# Patient Record
Sex: Male | Born: 1995 | Race: Black or African American | Hispanic: No | Marital: Single | Smoking: Never smoker
Health system: Southern US, Community
[De-identification: ages and names within clinical notes are randomized; demographics above are authoritative.]

## PROBLEM LIST (undated history)

## (undated) DIAGNOSIS — B2790 Infectious mononucleosis, unspecified without complication: Secondary | ICD-10-CM

## (undated) HISTORY — PX: WISDOM TOOTH EXTRACTION: SHX21

---
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
## 2018-02-21 IMAGING — DX DG CHEST 2V
2 series · 2 of 2 positions shown · non-contrast
Comparison: 12/14/2013

CLINICAL DATA: Productive cough for 2 weeks, vomiting and nausea
this morning

EXAM:
CHEST  2 VIEW

[chest pa]
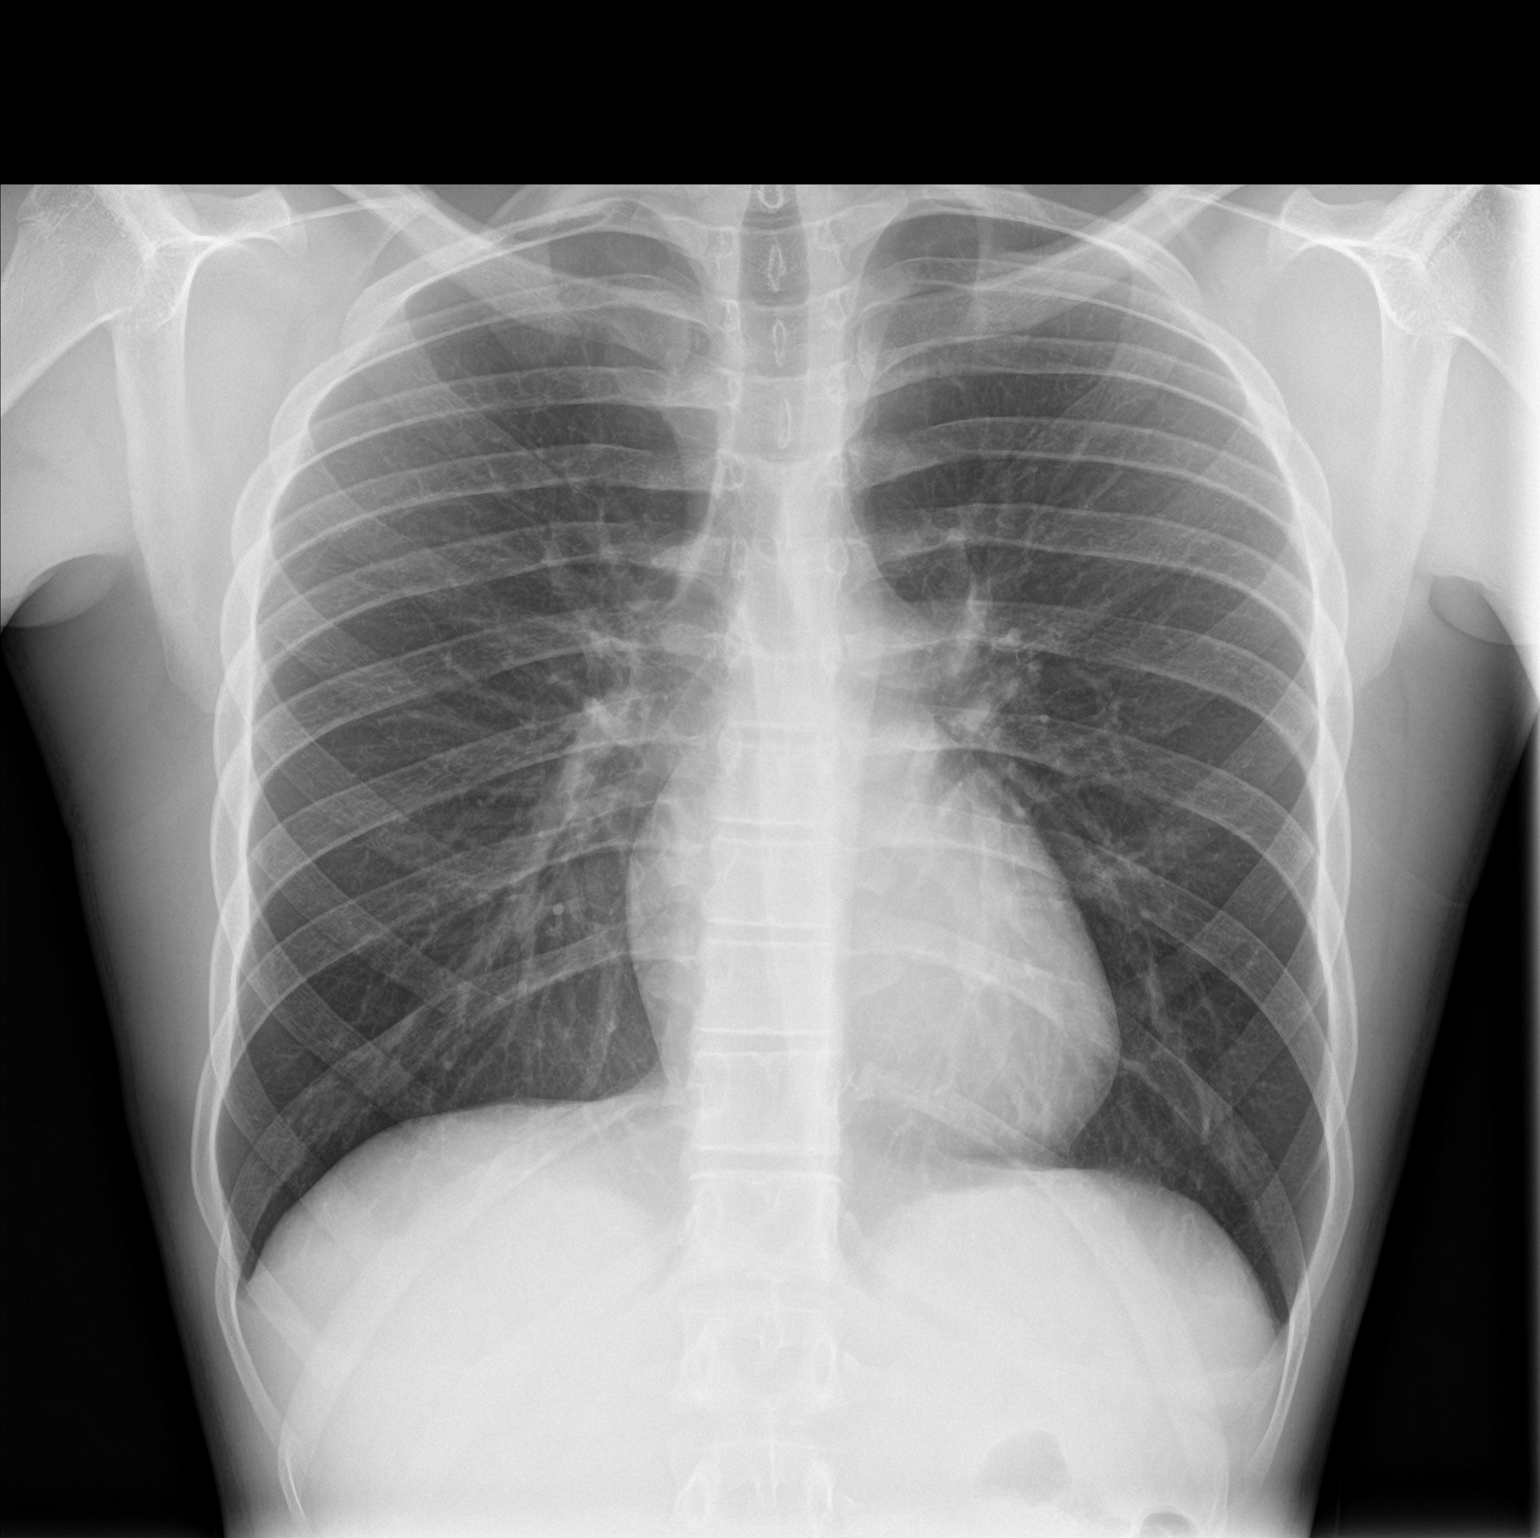

[chest lat]
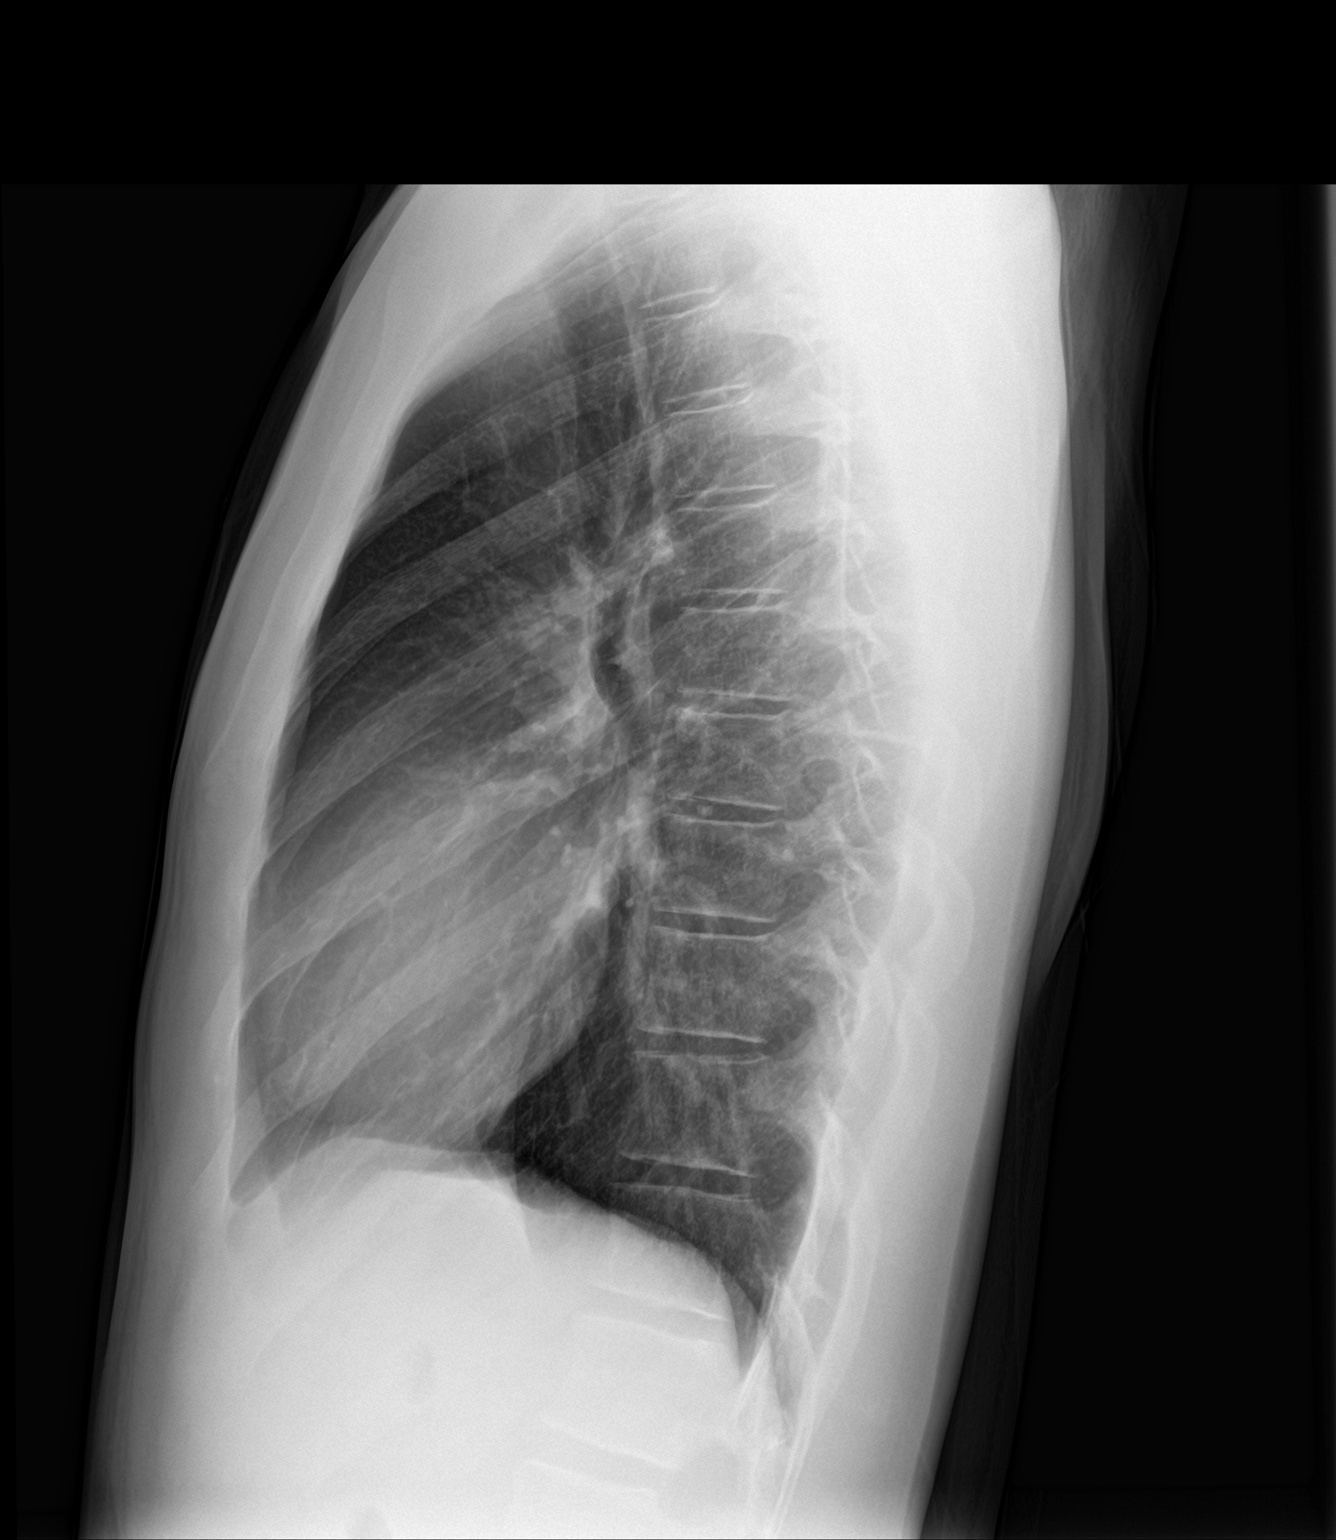

[2 of 2 positions shown; findings below may reference images not displayed]

FINDINGS: Normal heart size, mediastinal contours, and pulmonary vascularity.

Lungs clear.

No pleural effusion or pneumothorax.

Bones unremarkable.
IMPRESSION: Normal exam.

## 2018-03-23 IMAGING — CR DG CHEST 2V
2 series · 2 of 2 positions shown · non-contrast
Comparison: 05/15/2016

CLINICAL DATA: Cough and fever.  Recent diagnosis of mononucleosis.

EXAM:
CHEST  2 VIEW

[w chest pa]
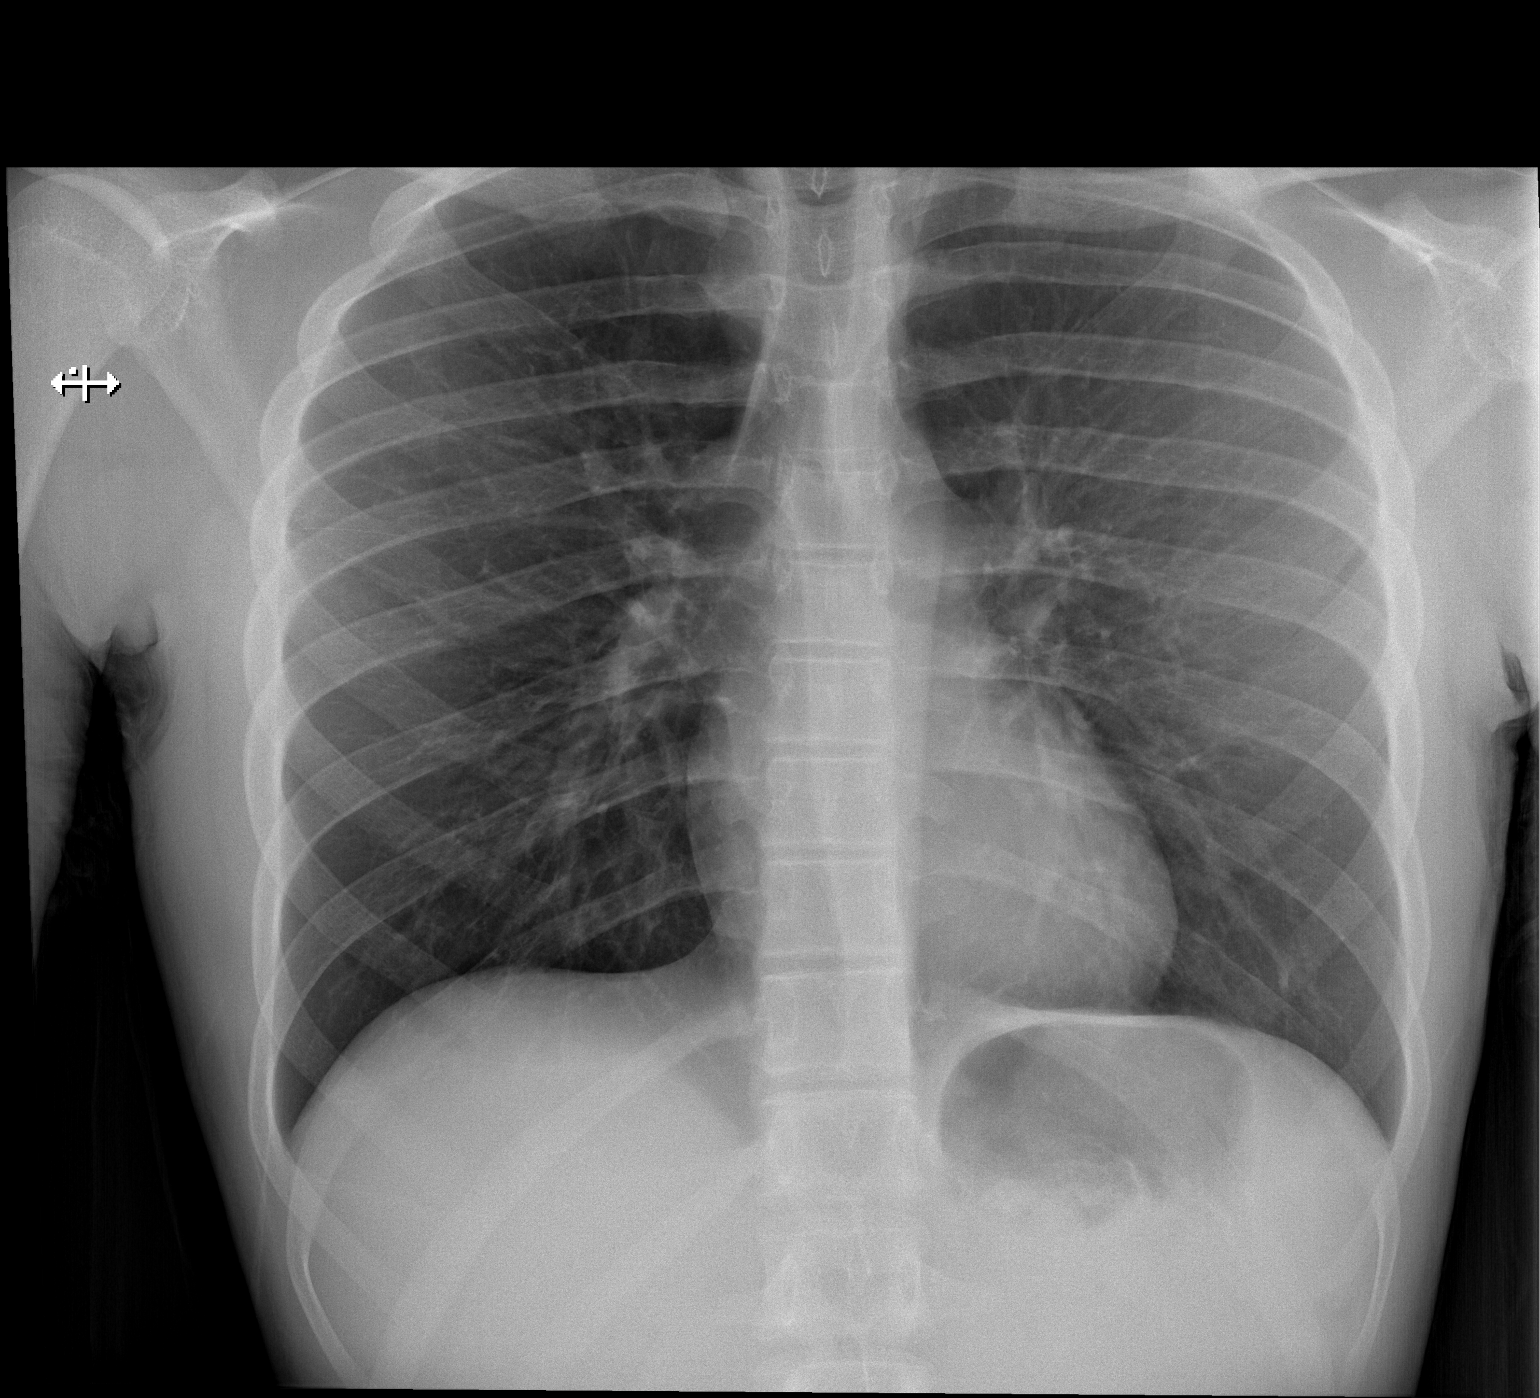

[w chest lat]
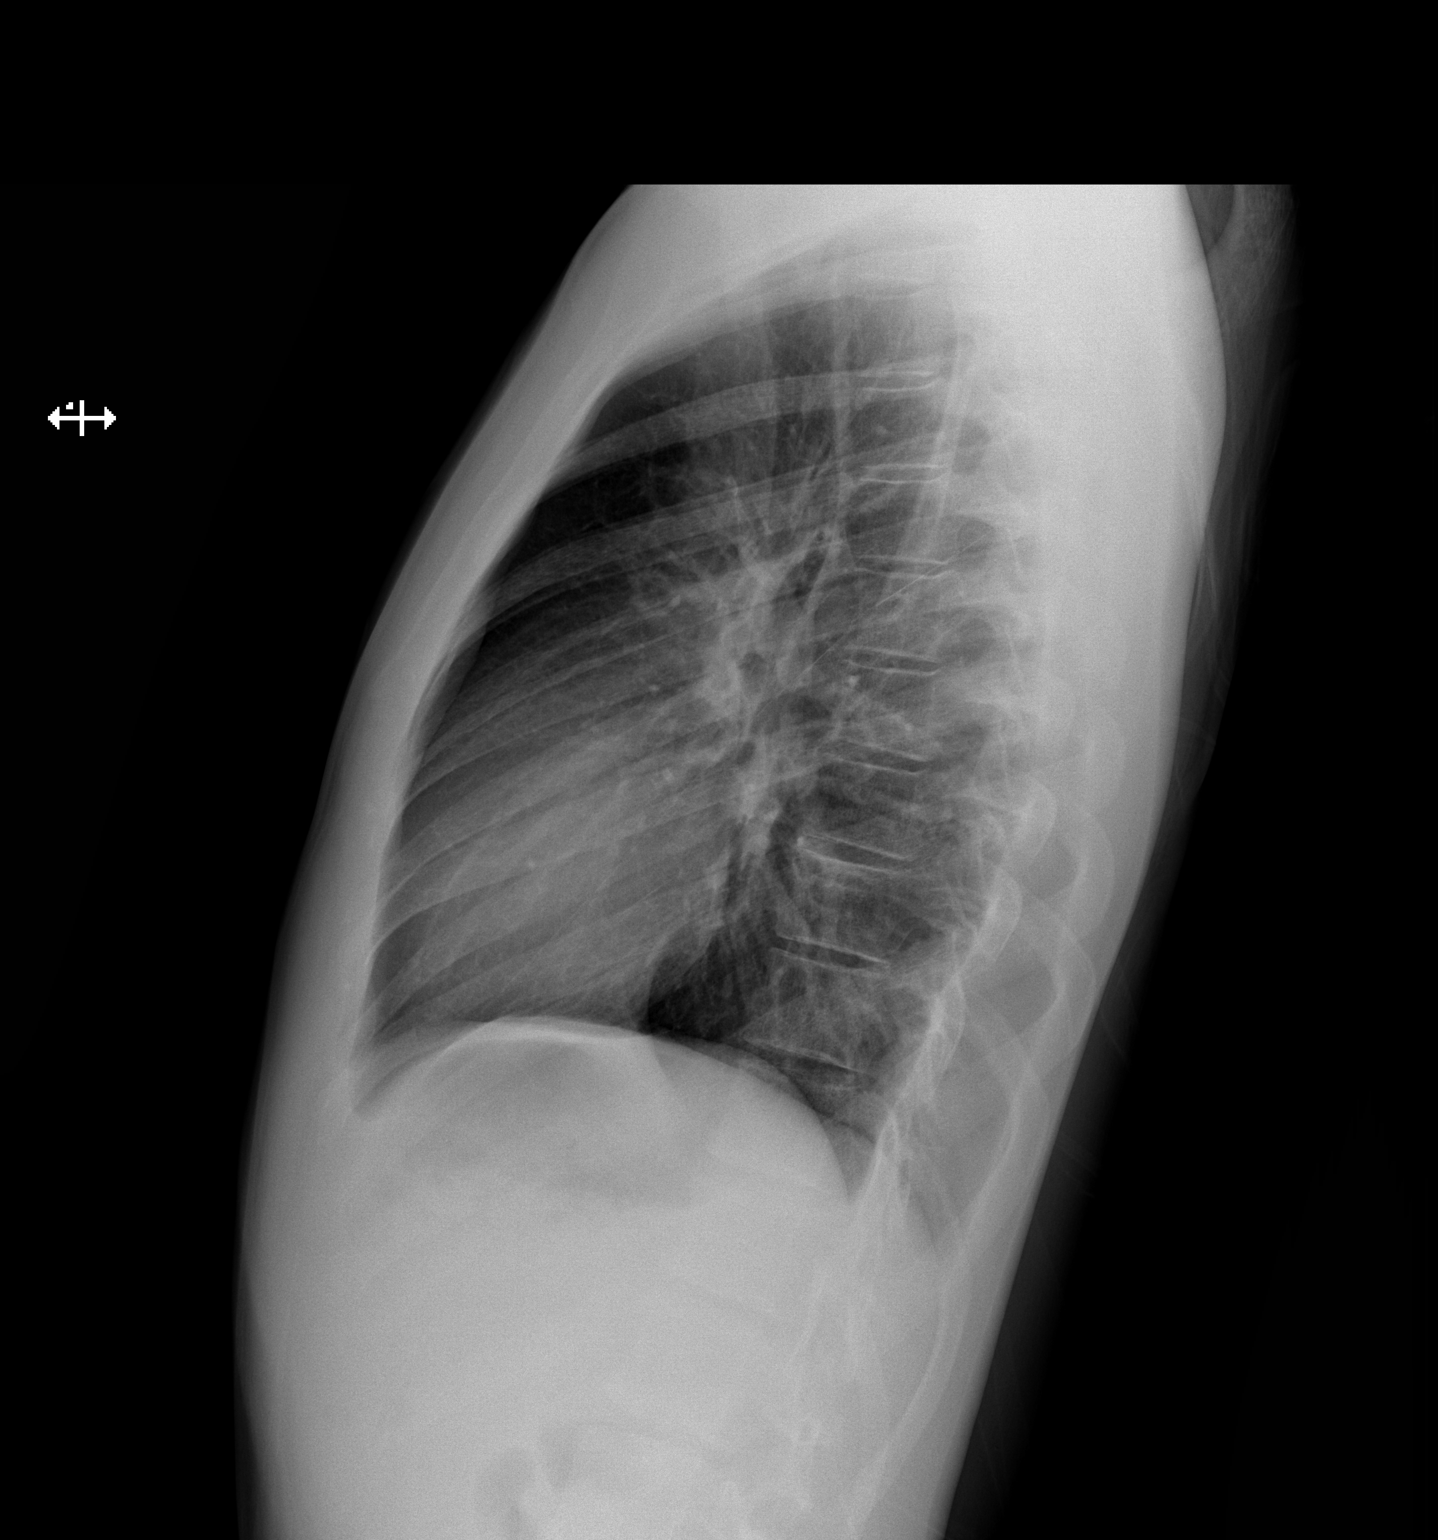

[2 of 2 positions shown; findings below may reference images not displayed]

FINDINGS: The cardiomediastinal contours are normal. The lungs are clear.
Pulmonary vasculature is normal. No consolidation, pleural effusion,
or pneumothorax. No acute osseous abnormalities are seen.
IMPRESSION: No active cardiopulmonary disease.  No change from prior exam.

## 2018-03-27 ENCOUNTER — Encounter (HOSPITAL_COMMUNITY): Payer: Self-pay | Admitting: Emergency Medicine

## 2018-03-27 ENCOUNTER — Emergency Department (HOSPITAL_COMMUNITY)
Admission: EM | Admit: 2018-03-27 | Discharge: 2018-03-27 | Disposition: A | Discharge status: Home / Self Care | Payer: Self-pay | Attending: Emergency Medicine | Admitting: Emergency Medicine

## 2018-03-27 DIAGNOSIS — R51 Headache: Secondary | ICD-10-CM | POA: Insufficient documentation

## 2018-03-27 DIAGNOSIS — J0101 Acute recurrent maxillary sinusitis: Secondary | ICD-10-CM | POA: Insufficient documentation

## 2018-03-27 DIAGNOSIS — Z79899 Other long term (current) drug therapy: Secondary | ICD-10-CM | POA: Insufficient documentation

## 2018-03-27 DIAGNOSIS — E860 Dehydration: Secondary | ICD-10-CM | POA: Insufficient documentation

## 2018-03-27 LAB — COMPREHENSIVE METABOLIC PANEL
ALBUMIN: 4.4 g/dL (ref 3.5–5.0)
ALT: 12 U/L (ref 0–44)
ANION GAP: 7 (ref 5–15)
AST: 18 U/L (ref 15–41)
Alkaline Phosphatase: 79 U/L (ref 38–126)
BUN: 13 mg/dL (ref 6–20)
CO2: 27 mmol/L (ref 22–32)
Calcium: 9.2 mg/dL (ref 8.9–10.3)
Chloride: 107 mmol/L (ref 98–111)
Creatinine, Ser: 1.03 mg/dL (ref 0.61–1.24)
GFR calc non Af Amer: >60 mL/min (ref >60)
GLUCOSE: 104 mg/dL — AB (ref 70–99)
POTASSIUM: 4 mmol/L (ref 3.5–5.1)
SODIUM: 141 mmol/L (ref 135–145)
Total Bilirubin: 2.1 mg/dL — ABNORMAL HIGH (ref 0.3–1.2)
Total Protein: 7.4 g/dL (ref 6.5–8.1)

## 2018-03-27 LAB — CBC
HEMATOCRIT: 42.4 % (ref 39.0–52.0)
HEMOGLOBIN: 13.9 g/dL (ref 13.0–17.0)
MCH: 31.3 pg (ref 26.0–34.0)
MCHC: 32.8 g/dL (ref 30.0–36.0)
MCV: 95.5 fL (ref 78.0–100.0)
Platelets: 267 10*3/uL (ref 150–400)
RBC: 4.44 MIL/uL (ref 4.22–5.81)
RDW: 11.2 % — ABNORMAL LOW (ref 11.5–15.5)
WBC: 5.6 10*3/uL (ref 4.0–10.5)

## 2018-03-27 LAB — URINALYSIS, ROUTINE W REFLEX MICROSCOPIC
BILIRUBIN URINE: NEGATIVE
Glucose, UA: NEGATIVE mg/dL
Hgb urine dipstick: NEGATIVE
Ketones, ur: NEGATIVE mg/dL
Leukocytes, UA: NEGATIVE
NITRITE: NEGATIVE
PH: 6 (ref 5.0–8.0)
Protein, ur: NEGATIVE mg/dL
SPECIFIC GRAVITY, URINE: 1.024 (ref 1.005–1.030)

## 2018-03-27 LAB — LIPASE, BLOOD: Lipase: 32 U/L (ref 11–51)

## 2018-03-27 LAB — COOXEMETRY PANEL
CARBOXYHEMOGLOBIN: 0.9 % (ref 0.5–1.5)
METHEMOGLOBIN: 1 % (ref 0.0–1.5)
O2 SAT: 97.5 %
Total hemoglobin: 13.3 g/dL (ref 12.0–16.0)

## 2018-03-27 MED ORDER — PROMETHAZINE HCL 25 MG PO TABS: 25.0000 mg | ORAL_TABLET | Freq: Three times a day (TID) | ORAL | 0 refills | Status: DC | PRN

## 2018-03-27 MED ORDER — SODIUM CHLORIDE 0.9 % IV BOLUS
1000.0000 mL | Freq: Once | INTRAVENOUS | Status: AC
  Administered 2018-03-27: 1000 mL via INTRAVENOUS

## 2018-03-27 MED ORDER — METOCLOPRAMIDE HCL 5 MG/ML IJ SOLN
10.0000 mg | Freq: Once | INTRAMUSCULAR | Status: AC
Start: 2018-03-27 — End: 2018-03-27
  Administered 2018-03-27: 10 mg via INTRAVENOUS
  Filled 2018-03-27: qty 2

## 2018-03-27 MED ORDER — KETOROLAC TROMETHAMINE 15 MG/ML IJ SOLN
15.0000 mg | Freq: Once | INTRAMUSCULAR | Status: AC
  Administered 2018-03-27: 15 mg via INTRAVENOUS
  Filled 2018-03-27: qty 1

## 2018-03-27 MED ORDER — DIPHENHYDRAMINE HCL 50 MG/ML IJ SOLN
25.0000 mg | Freq: Once | INTRAMUSCULAR | Status: AC
Start: 2018-03-27 — End: 2018-03-27
  Administered 2018-03-27: 25 mg via INTRAVENOUS
  Filled 2018-03-27: qty 1

## 2018-03-27 MED ORDER — IBUPROFEN 600 MG PO TABS: 600.0000 mg | ORAL_TABLET | Freq: Three times a day (TID) | ORAL | 0 refills | Status: DC | PRN

## 2018-03-27 MED ORDER — FLUTICASONE PROPIONATE 50 MCG/ACT NA SUSP: 2.0000 | Freq: Every day | NASAL | 0 refills | Status: DC

## 2018-03-27 MED ORDER — AMOXICILLIN-POT CLAVULANATE 875-125 MG PO TABS: 1.0000 | ORAL_TABLET | Freq: Two times a day (BID) | ORAL | 0 refills | Status: AC

## 2018-03-27 NOTE — ED Provider Notes (Signed)
[facility]-EMERGENCY DEPT Provider Note   CSN: [id number] Arrival date & time: 03/27/18  8657     History   Chief Complaint Chief Complaint  Patient presents with  . Nausea  . Headache    HPI Louis Camacho is a 22 y.o. male.  HPI   22 yo M with PMHx as below here with mild nausea, headache. Pt states he awoke 3 days ago with mild nausea, vomiting. He has had persistent nausea since then, but has been able to eat/drink. No abdominal pain. No dysuria. He has also had a mild HA x 1 week. HA began gradually, is described as a frontal HA with sinus pressure, nasal congestion. HA worsens when leaning forward. No alleviating factors. 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 No recent trauma. No current HA, neck stiffness, rigidity, photophobia, or phonophobia.    Past Medical History:  Diagnosis Date  . Mononucleosis     There are no active problems to display for this patient.   Past Surgical History:  Procedure Laterality Date  . WISDOM TOOTH EXTRACTION          Home Medications    Prior to Admission medications   Medication Sig Start Date End Date Taking? Authorizing Provider  amoxicillin-clavulanate (AUGMENTIN) 875-125 MG tablet Take 1 tablet by mouth every 12 (twelve) hours for 10 days. 03/27/18 04/06/18  Shaune Pollack, MD  fluticasone (FLONASE) 50 MCG/ACT nasal spray Place 2 sprays into both nostrils daily. 03/27/18   Shaune Pollack, MD  guaiFENesin (MUCINEX) 600 MG 12 hr tablet Take 1 tablet (600 mg total) by mouth 2 (two) times daily as needed for to loosen phlegm. Patient not taking: Reported on 03/27/2018 05/15/16   Sam, Ace Gins, PA-C  ibuprofen (ADVIL,MOTRIN) 600 MG tablet Take 1 tablet (600 mg total) by mouth every 8 (eight) hours as needed for moderate pain. 03/27/18   Shaune Pollack, MD  lidocaine (XYLOCAINE) 2 % solution Use as directed 15 mLs in the  mouth or throat every 4 (four) hours as needed for mouth pain. Patient not taking: Reported on 03/27/2018 06/14/16   Jerre Simon, PA  loratadine (CLARITIN) 10 MG tablet Take 1 tablet (10 mg total) by mouth daily. Patient not taking: Reported on 03/27/2018 07/15/16   Hedges, Tinnie Gens, PA-C  promethazine (PHENERGAN) 25 MG tablet Take 1 tablet (25 mg total) by mouth every 8 (eight) hours as needed for nausea or vomiting. 03/27/18   Shaune Pollack, MD  promethazine-dextromethorphan (PROMETHAZINE-DM) 6.25-15 MG/5ML syrup Take 5 mLs by mouth 4 (four) times daily as needed for cough. Patient not taking: Reported on 03/27/2018 05/15/16   Carlene Coria, PA-C    Family History No family history on file.  Social History Social History   Tobacco Use  . Smoking status: Never Smoker  . Smokeless tobacco: Never Used  Substance Use Topics  . Alcohol use: No  . Drug use: No     Allergies   Maple flavor; Soy allergy; Uncaria tomentosa (cats claw); and Sugar-protein-starch   Review of Systems Review of Systems  Constitutional: Positive for fatigue. Negative for chills and fever.  HENT: Negative for congestion and rhinorrhea.   Eyes: Negative for visual disturbance.  Respiratory: Negative for cough, shortness of breath and wheezing.   Cardiovascular: Negative for chest pain and leg swelling.  Gastrointestinal: Positive for nausea and vomiting. Negative for abdominal pain and diarrhea.  Genitourinary:  Negative for dysuria and flank pain.  Musculoskeletal: Negative for neck pain and neck stiffness.  Skin: Negative for rash and wound.  Allergic/Immunologic: Negative for immunocompromised state.  Neurological: Positive for light-headedness. Negative for syncope, weakness and headaches.  All other systems reviewed and are negative.    Physical Exam Updated Vital Signs BP 113/72 (BP Location: Left Arm)   Pulse 89   Temp 98.7 F (37.1 C) (Oral)   Resp 16   SpO2 97%   Physical Exam    Constitutional: He is oriented to person, place, and time. He appears well-developed and well-nourished. No distress.  HENT:  Head: Normocephalic and atraumatic.  Mildly dry MM. Nasal congestion b/l with sinus fullness maxillary sinuses. No facial erythema. No dental/oral lesions.  Eyes: Conjunctivae are normal.  Neck: Neck supple.  No neck stiffness or rigidity.  Cardiovascular: Normal rate, regular rhythm and normal heart sounds. Exam reveals no friction rub.  No murmur heard. Pulmonary/Chest: Effort normal and breath sounds normal. No respiratory distress. He has no wheezes. He has no rales.  Abdominal: He exhibits no distension.  Musculoskeletal: He exhibits no edema.  Neurological: He is alert and oriented to person, place, and time. No cranial nerve deficit or sensory deficit. He exhibits normal muscle tone. Gait normal. GCS eye subscore is 4. GCS verbal subscore is 5. GCS motor subscore is 6.  Skin: Skin is warm. Capillary refill takes less than 2 seconds.  Psychiatric: He has a normal mood and affect.  Nursing note and vitals reviewed.    ED Treatments / Results  Labs (all labs ordered are listed, but only abnormal results are displayed) Labs Reviewed  COMPREHENSIVE METABOLIC PANEL - Abnormal; Notable for the following components:      Result Value   Glucose, Bld 104 (*)    Total Bilirubin 2.1 (*)    All other components within normal limits  CBC - Abnormal; Notable for the following components:   RDW 11.2 (*)    All other components within normal limits  URINALYSIS, ROUTINE W REFLEX MICROSCOPIC - Abnormal; Notable for the following components:   Color, Urine AMBER (*)    All other components within normal limits  LIPASE, BLOOD  COOXEMETRY PANEL    EKG None  Radiology No results found.  Procedures Procedures (including critical care time)  Medications Ordered in ED Medications  sodium chloride 0.9 % bolus 1,000 mL (1,000 mLs Intravenous New Bag/Given 03/27/18  1015)  metoCLOPramide (REGLAN) injection 10 mg (10 mg Intravenous Given 03/27/18 1015)  diphenhydrAMINE (BENADRYL) injection 25 mg (25 mg Intravenous Given 03/27/18 1015)  ketorolac (TORADOL) 15 MG/ML injection 15 mg (15 mg Intravenous Given 03/27/18 1015)     Initial Impression / Assessment and Plan / ED Course  I have reviewed the triage vital signs and the nursing notes.  Pertinent labs & imaging results that were available during my care of the patient were reviewed by me and considered in my medical decision making (see chart for details).    22 year old male here with mild headache.  I suspect this is secondary to sinusitis which is exacerbated in the setting of exposure to multiple chemicals and smells at work as well as working in a heated environment.  He may also have a component of dehydration.  Patient given fluids and migraine meds with improvement.  He has no focal neuro deficits and has a long history of headaches, the do not feel repeat imaging is indicated.  No fever, photophobia, or signs of meningitis  or encephalitis.  His nausea is likely related to his dehydration and headache and he has no abdominal tenderness.  He has normal LFTs, CBC, and lipase.  He is tolerating p.o. without difficulty.  Will treat him for his mild sinusitis, encourage fluids, and have him rest from work for 2 days, then return home.  Final Clinical Impressions(s) / ED Diagnoses   Final diagnoses:  Acute recurrent maxillary sinusitis  Dehydration    ED Discharge Orders        Ordered    amoxicillin-clavulanate (AUGMENTIN) 875-125 MG tablet  Every 12 hours     03/27/18 1116    promethazine (PHENERGAN) 25 MG tablet  Every 8 hours PRN     03/27/18 1116    ibuprofen (ADVIL,MOTRIN) 600 MG tablet  Every 8 hours PRN     03/27/18 1116    fluticasone (FLONASE) 50 MCG/ACT nasal spray  Daily     03/27/18 1119       Shaune Pollack, MD 03/27/18 1133

## 2018-03-27 NOTE — Discharge Instructions (Signed)
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  If possible, I would recommend using a fan and trying to take regular breaks at work, as I suspect her symptoms are due to heat exposure and dehydration.

## 2018-03-27 NOTE — ED Triage Notes (Signed)
Pt c/o headaches for about 2 weeks that are constant due "to where I work at is in the heat".  Pt has nausea every morning when wakes up for the past 3 days. Reports Tuesday morning actually vomited once. Yesterday reports diarrhea

## 2018-03-27 NOTE — ED Notes (Signed)
Bed: WA09 Expected date:  Expected time:  Means of arrival:  Comments: 

## 2021-06-16 ENCOUNTER — Other Ambulatory Visit: Payer: Self-pay

## 2021-06-16 ENCOUNTER — Emergency Department (HOSPITAL_BASED_OUTPATIENT_CLINIC_OR_DEPARTMENT_OTHER)
Admission: EM | Admit: 2021-06-16 | Discharge: 2021-06-16 | Disposition: A | Discharge status: Home / Self Care | Payer: Medicaid Other | Attending: Emergency Medicine | Admitting: Emergency Medicine

## 2021-06-16 ENCOUNTER — Encounter (HOSPITAL_BASED_OUTPATIENT_CLINIC_OR_DEPARTMENT_OTHER): Payer: Self-pay

## 2021-06-16 DIAGNOSIS — Y92481 Parking lot as the place of occurrence of the external cause: Secondary | ICD-10-CM | POA: Insufficient documentation

## 2021-06-16 DIAGNOSIS — R519 Headache, unspecified: Secondary | ICD-10-CM | POA: Insufficient documentation

## 2021-06-16 MED ORDER — CYCLOBENZAPRINE HCL 10 MG PO TABS: 10.0000 mg | ORAL_TABLET | Freq: Two times a day (BID) | ORAL | 0 refills | Status: DC | PRN

## 2021-06-16 MED ORDER — IBUPROFEN 600 MG PO TABS: 600.0000 mg | ORAL_TABLET | Freq: Three times a day (TID) | ORAL | 0 refills | Status: DC | PRN
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
## 2023-10-16 ENCOUNTER — Emergency Department (HOSPITAL_COMMUNITY): Payer: Self-pay

## 2023-10-16 ENCOUNTER — Inpatient Hospital Stay (HOSPITAL_COMMUNITY): Payer: Self-pay

## 2023-10-16 ENCOUNTER — Inpatient Hospital Stay (HOSPITAL_COMMUNITY)
Admission: EM | Admit: 2023-10-16 | Discharge: 2023-10-18 | DRG: 481 | Disposition: A | Discharge status: Home / Self Care | Payer: Self-pay | Attending: Surgery | Admitting: Surgery

## 2023-10-16 ENCOUNTER — Encounter (HOSPITAL_COMMUNITY): Payer: Self-pay

## 2023-10-16 ENCOUNTER — Other Ambulatory Visit: Payer: Self-pay

## 2023-10-16 ENCOUNTER — Encounter (HOSPITAL_COMMUNITY): Admission: EM | Disposition: A | Discharge status: Home / Self Care | Payer: Self-pay | Source: Home / Self Care

## 2023-10-16 DIAGNOSIS — Z79899 Other long term (current) drug therapy: Secondary | ICD-10-CM

## 2023-10-16 DIAGNOSIS — W3400XA Accidental discharge from unspecified firearms or gun, initial encounter: Principal | ICD-10-CM

## 2023-10-16 DIAGNOSIS — S7291XA Unspecified fracture of right femur, initial encounter for closed fracture: Secondary | ICD-10-CM

## 2023-10-16 DIAGNOSIS — F17290 Nicotine dependence, other tobacco product, uncomplicated: Secondary | ICD-10-CM | POA: Diagnosis present

## 2023-10-16 DIAGNOSIS — Z9109 Other allergy status, other than to drugs and biological substances: Secondary | ICD-10-CM

## 2023-10-16 DIAGNOSIS — Z23 Encounter for immunization: Secondary | ICD-10-CM

## 2023-10-16 DIAGNOSIS — F419 Anxiety disorder, unspecified: Secondary | ICD-10-CM | POA: Diagnosis present

## 2023-10-16 DIAGNOSIS — M62838 Other muscle spasm: Secondary | ICD-10-CM | POA: Diagnosis present

## 2023-10-16 DIAGNOSIS — Z91018 Allergy to other foods: Secondary | ICD-10-CM

## 2023-10-16 DIAGNOSIS — F430 Acute stress reaction: Secondary | ICD-10-CM | POA: Diagnosis present

## 2023-10-16 DIAGNOSIS — S72351B Displaced comminuted fracture of shaft of right femur, initial encounter for open fracture type I or II: Principal | ICD-10-CM | POA: Diagnosis present

## 2023-10-16 DIAGNOSIS — D62 Acute posthemorrhagic anemia: Secondary | ICD-10-CM | POA: Diagnosis present

## 2023-10-16 DIAGNOSIS — Z8619 Personal history of other infectious and parasitic diseases: Secondary | ICD-10-CM

## 2023-10-16 HISTORY — PX: FEMUR IM NAIL: SHX1597

## 2023-10-16 LAB — I-STAT CHEM 8, ED
BUN: 7 mg/dL (ref 6–20)
Calcium, Ion: 1.05 mmol/L — ABNORMAL LOW (ref 1.15–1.40)
Chloride: 105 mmol/L (ref 98–111)
Creatinine, Ser: 1 mg/dL (ref 0.61–1.24)
Glucose, Bld: 125 mg/dL — ABNORMAL HIGH (ref 70–99)
HCT: 46 % (ref 39.0–52.0)
Hemoglobin: 15.6 g/dL (ref 13.0–17.0)
Potassium: 3.2 mmol/L — ABNORMAL LOW (ref 3.5–5.1)
Sodium: 141 mmol/L (ref 135–145)
TCO2: 22 mmol/L (ref 22–32)

## 2023-10-16 LAB — URINALYSIS, ROUTINE W REFLEX MICROSCOPIC
Bacteria, UA: NONE SEEN
Bilirubin Urine: NEGATIVE
Glucose, UA: NEGATIVE mg/dL
Ketones, ur: 5 mg/dL — AB
Leukocytes,Ua: NEGATIVE
Nitrite: NEGATIVE
Protein, ur: NEGATIVE mg/dL
Specific Gravity, Urine: 1.041 — ABNORMAL HIGH (ref 1.005–1.030)
pH: 6 (ref 5.0–8.0)

## 2023-10-16 LAB — ETHANOL: Alcohol, Ethyl (B): 45 mg/dL — ABNORMAL HIGH (ref <10)

## 2023-10-16 LAB — BASIC METABOLIC PANEL
Anion gap: 9 (ref 5–15)
BUN: <5 mg/dL — ABNORMAL LOW (ref 6–20)
CO2: 23 mmol/L (ref 22–32)
Calcium: 7.8 mg/dL — ABNORMAL LOW (ref 8.9–10.3)
Chloride: 109 mmol/L (ref 98–111)
Creatinine, Ser: 0.8 mg/dL (ref 0.61–1.24)
GFR, Estimated: >60 mL/min (ref >60)
Glucose, Bld: 96 mg/dL (ref 70–99)
Potassium: 3.9 mmol/L (ref 3.5–5.1)
Sodium: 141 mmol/L (ref 135–145)

## 2023-10-16 LAB — COMPREHENSIVE METABOLIC PANEL
ALT: 14 U/L (ref 0–44)
AST: 24 U/L (ref 15–41)
Albumin: 3.6 g/dL (ref 3.5–5.0)
Alkaline Phosphatase: 56 U/L (ref 38–126)
Anion gap: 13 (ref 5–15)
BUN: 6 mg/dL (ref 6–20)
CO2: 20 mmol/L — ABNORMAL LOW (ref 22–32)
Calcium: 8 mg/dL — ABNORMAL LOW (ref 8.9–10.3)
Chloride: 106 mmol/L (ref 98–111)
Creatinine, Ser: 0.9 mg/dL (ref 0.61–1.24)
GFR, Estimated: >60 mL/min (ref >60)
Glucose, Bld: 106 mg/dL — ABNORMAL HIGH (ref 70–99)
Potassium: 3.6 mmol/L (ref 3.5–5.1)
Sodium: 139 mmol/L (ref 135–145)
Total Bilirubin: 0.7 mg/dL (ref 0.0–1.2)
Total Protein: 6.1 g/dL — ABNORMAL LOW (ref 6.5–8.1)

## 2023-10-16 LAB — PROTIME-INR
INR: 1 (ref 0.8–1.2)
Prothrombin Time: 13.6 s (ref 11.4–15.2)

## 2023-10-16 LAB — CBC
HCT: 38.9 % — ABNORMAL LOW (ref 39.0–52.0)
HCT: 38.9 % — ABNORMAL LOW (ref 39.0–52.0)
Hemoglobin: 12.4 g/dL — ABNORMAL LOW (ref 13.0–17.0)
Hemoglobin: 12.6 g/dL — ABNORMAL LOW (ref 13.0–17.0)
MCH: 32 pg (ref 26.0–34.0)
MCH: 32.1 pg (ref 26.0–34.0)
MCHC: 31.9 g/dL (ref 30.0–36.0)
MCHC: 32.4 g/dL (ref 30.0–36.0)
MCV: 100.3 fL — ABNORMAL HIGH (ref 80.0–100.0)
MCV: 99 fL (ref 80.0–100.0)
Platelets: 191 10*3/uL (ref 150–400)
Platelets: 216 10*3/uL (ref 150–400)
RBC: 3.88 MIL/uL — ABNORMAL LOW (ref 4.22–5.81)
RBC: 3.93 MIL/uL — ABNORMAL LOW (ref 4.22–5.81)
RDW: 11.4 % — ABNORMAL LOW (ref 11.5–15.5)
RDW: 11.4 % — ABNORMAL LOW (ref 11.5–15.5)
WBC: 12.6 10*3/uL — ABNORMAL HIGH (ref 4.0–10.5)
WBC: 11.8 10*3/uL — ABNORMAL HIGH (ref 4.0–10.5)
nRBC: 0 % (ref 0.0–0.2)
nRBC: 0 % (ref 0.0–0.2)

## 2023-10-16 LAB — SAMPLE TO BLOOD BANK

## 2023-10-16 LAB — I-STAT CG4 LACTIC ACID, ED: Lactic Acid, Venous: 5.4 mmol/L (ref 0.5–1.9)

## 2023-10-16 LAB — SURGICAL PCR SCREEN
MRSA, PCR: NEGATIVE
Staphylococcus aureus: NEGATIVE

## 2023-10-16 LAB — HIV ANTIBODY (ROUTINE TESTING W REFLEX): HIV Screen 4th Generation wRfx: NONREACTIVE

## 2023-10-16 SURGERY — INSERTION, INTRAMEDULLARY ROD, FEMUR
Anesthesia: General | Site: Leg Upper | Laterality: Right

## 2023-10-16 MED ORDER — DEXAMETHASONE SODIUM PHOSPHATE 10 MG/ML IJ SOLN
INTRAMUSCULAR | Status: DC | PRN
  Administered 2023-10-16: 5 mg via INTRAVENOUS

## 2023-10-16 MED ORDER — CHLORHEXIDINE GLUCONATE 0.12 % MT SOLN
OROMUCOSAL | Status: AC
  Filled 2023-10-16: qty 15

## 2023-10-16 MED ORDER — DEXAMETHASONE SODIUM PHOSPHATE 10 MG/ML IJ SOLN
INTRAMUSCULAR | Status: AC
  Filled 2023-10-16: qty 1

## 2023-10-16 MED ORDER — PHENYLEPHRINE 80 MCG/ML (10ML) SYRINGE FOR IV PUSH (FOR BLOOD PRESSURE SUPPORT)
PREFILLED_SYRINGE | INTRAVENOUS | Status: DC | PRN
  Administered 2023-10-16: 160 ug via INTRAVENOUS
  Administered 2023-10-16 (×2): 80 ug via INTRAVENOUS

## 2023-10-16 MED ORDER — METHOCARBAMOL 1000 MG/10ML IJ SOLN: 500.0000 mg | Freq: Three times a day (TID) | INTRAMUSCULAR | Status: DC

## 2023-10-16 MED ORDER — ORAL CARE MOUTH RINSE: 15.0000 mL | Freq: Once | OROMUCOSAL | Status: DC

## 2023-10-16 MED ORDER — POLYETHYLENE GLYCOL 3350 17 G PO PACK
17.0000 g | PACK | Freq: Every day | ORAL | Status: DC | PRN
Start: 2023-10-16 — End: 2023-10-16

## 2023-10-16 MED ORDER — CEFAZOLIN SODIUM-DEXTROSE 2-4 GM/100ML-% IV SOLN
2.0000 g | Freq: Three times a day (TID) | INTRAVENOUS | Status: AC
  Administered 2023-10-16 – 2023-10-17 (×3): 2 g via INTRAVENOUS
  Filled 2023-10-16 (×3): qty 100

## 2023-10-16 MED ORDER — ACETAMINOPHEN 10 MG/ML IV SOLN
INTRAVENOUS | Status: DC | PRN
  Administered 2023-10-16: 1000 mg via INTRAVENOUS

## 2023-10-16 MED ORDER — VANCOMYCIN HCL 1000 MG IV SOLR
INTRAVENOUS | Status: AC
  Filled 2023-10-16: qty 20

## 2023-10-16 MED ORDER — CEFAZOLIN SODIUM-DEXTROSE 2-4 GM/100ML-% IV SOLN: 2.0000 g | Freq: Once | INTRAVENOUS | Status: DC

## 2023-10-16 MED ORDER — POVIDONE-IODINE 10 % EX SWAB
2.0000 | Freq: Once | CUTANEOUS | Status: AC
  Administered 2023-10-16: 2 via TOPICAL

## 2023-10-16 MED ORDER — ACETAMINOPHEN 500 MG PO TABS
1000.0000 mg | ORAL_TABLET | Freq: Four times a day (QID) | ORAL | Status: DC
  Administered 2023-10-16 – 2023-10-18 (×8): 1000 mg via ORAL
  Filled 2023-10-16 (×8): qty 2

## 2023-10-16 MED ORDER — HYDROMORPHONE HCL 1 MG/ML IJ SOLN
1.0000 mg | INTRAMUSCULAR | Status: DC | PRN
  Administered 2023-10-16: 1 mg via INTRAVENOUS
  Filled 2023-10-16: qty 1

## 2023-10-16 MED ORDER — DIPHENHYDRAMINE HCL 12.5 MG/5ML PO ELIX: 12.5000 mg | ORAL_SOLUTION | ORAL | Status: DC | PRN

## 2023-10-16 MED ORDER — CHLORHEXIDINE GLUCONATE 4 % EX SOLN
60.0000 mL | Freq: Once | CUTANEOUS | Status: AC
  Administered 2023-10-16: 4 via TOPICAL
  Filled 2023-10-16: qty 60

## 2023-10-16 MED ORDER — ONDANSETRON HCL 4 MG/2ML IJ SOLN: 4.0000 mg | Freq: Four times a day (QID) | INTRAMUSCULAR | Status: DC | PRN

## 2023-10-16 MED ORDER — METHOCARBAMOL 500 MG PO TABS
500.0000 mg | ORAL_TABLET | Freq: Three times a day (TID) | ORAL | Status: DC
  Administered 2023-10-16: 500 mg via ORAL
  Filled 2023-10-16: qty 1

## 2023-10-16 MED ORDER — CEFAZOLIN SODIUM-DEXTROSE 2-4 GM/100ML-% IV SOLN
2.0000 g | Freq: Once | INTRAVENOUS | Status: AC
  Administered 2023-10-16: 2 g via INTRAVENOUS

## 2023-10-16 MED ORDER — DOCUSATE SODIUM 100 MG PO CAPS
100.0000 mg | ORAL_CAPSULE | Freq: Two times a day (BID) | ORAL | Status: DC
  Administered 2023-10-16: 100 mg via ORAL
  Filled 2023-10-16: qty 1

## 2023-10-16 MED ORDER — GABAPENTIN 300 MG PO CAPS
300.0000 mg | ORAL_CAPSULE | Freq: Three times a day (TID) | ORAL | Status: DC
  Administered 2023-10-16 – 2023-10-18 (×7): 300 mg via ORAL
  Filled 2023-10-16 (×7): qty 1

## 2023-10-16 MED ORDER — METOCLOPRAMIDE HCL 5 MG PO TABS: 5.0000 mg | ORAL_TABLET | Freq: Three times a day (TID) | ORAL | Status: DC | PRN

## 2023-10-16 MED ORDER — ONDANSETRON HCL 4 MG/2ML IJ SOLN
INTRAMUSCULAR | Status: DC | PRN
  Administered 2023-10-16: 4 mg via INTRAVENOUS

## 2023-10-16 MED ORDER — TRANEXAMIC ACID-NACL 1000-0.7 MG/100ML-% IV SOLN
1000.0000 mg | INTRAVENOUS | Status: AC
  Administered 2023-10-16: 1000 mg via INTRAVENOUS
  Filled 2023-10-16: qty 100

## 2023-10-16 MED ORDER — SODIUM CHLORIDE 0.9 % IV SOLN: INTRAVENOUS | Status: AC

## 2023-10-16 MED ORDER — FENTANYL CITRATE PF 50 MCG/ML IJ SOSY
PREFILLED_SYRINGE | INTRAMUSCULAR | Status: AC
  Filled 2023-10-16: qty 2

## 2023-10-16 MED ORDER — METHOCARBAMOL 500 MG PO TABS
1000.0000 mg | ORAL_TABLET | Freq: Three times a day (TID) | ORAL | Status: DC
  Administered 2023-10-16 – 2023-10-17 (×2): 1000 mg via ORAL
  Filled 2023-10-16 (×3): qty 2

## 2023-10-16 MED ORDER — ROCURONIUM BROMIDE 10 MG/ML (PF) SYRINGE
PREFILLED_SYRINGE | INTRAVENOUS | Status: DC | PRN
  Administered 2023-10-16: 50 mg via INTRAVENOUS
  Administered 2023-10-16: 20 mg via INTRAVENOUS

## 2023-10-16 MED ORDER — HYDROMORPHONE HCL 1 MG/ML IJ SOLN
0.5000 mg | INTRAMUSCULAR | Status: DC | PRN
  Administered 2023-10-16: 1 mg via INTRAVENOUS
  Filled 2023-10-16: qty 1

## 2023-10-16 MED ORDER — TETANUS-DIPHTH-ACELL PERTUSSIS 5-2.5-18.5 LF-MCG/0.5 IM SUSY: 0.5000 mL | PREFILLED_SYRINGE | Freq: Once | INTRAMUSCULAR | Status: DC

## 2023-10-16 MED ORDER — FENTANYL CITRATE (PF) 250 MCG/5ML IJ SOLN
INTRAMUSCULAR | Status: DC | PRN
  Administered 2023-10-16: 50 ug via INTRAVENOUS
  Administered 2023-10-16: 100 ug via INTRAVENOUS

## 2023-10-16 MED ORDER — LACTATED RINGERS IV SOLN: INTRAVENOUS | Status: DC

## 2023-10-16 MED ORDER — TRANEXAMIC ACID-NACL 1000-0.7 MG/100ML-% IV SOLN
1000.0000 mg | Freq: Once | INTRAVENOUS | Status: AC
  Administered 2023-10-16: 1000 mg via INTRAVENOUS
  Filled 2023-10-16: qty 100

## 2023-10-16 MED ORDER — OXYCODONE HCL 5 MG PO TABS
ORAL_TABLET | ORAL | Status: AC
  Filled 2023-10-16: qty 1

## 2023-10-16 MED ORDER — MIDAZOLAM HCL 2 MG/2ML IJ SOLN
INTRAMUSCULAR | Status: AC
Start: 2023-10-16 — End: ?
  Filled 2023-10-16: qty 2

## 2023-10-16 MED ORDER — ROCURONIUM BROMIDE 10 MG/ML (PF) SYRINGE
PREFILLED_SYRINGE | INTRAVENOUS | Status: AC
  Filled 2023-10-16: qty 10

## 2023-10-16 MED ORDER — FENTANYL CITRATE (PF) 100 MCG/2ML IJ SOLN: 25.0000 ug | INTRAMUSCULAR | Status: DC | PRN

## 2023-10-16 MED ORDER — LIDOCAINE 2% (20 MG/ML) 5 ML SYRINGE
INTRAMUSCULAR | Status: AC
  Filled 2023-10-16: qty 5

## 2023-10-16 MED ORDER — METHOCARBAMOL 1000 MG/10ML IJ SOLN
1000.0000 mg | Freq: Three times a day (TID) | INTRAMUSCULAR | Status: DC
  Filled 2023-10-16: qty 10

## 2023-10-16 MED ORDER — PROPOFOL 10 MG/ML IV BOLUS
INTRAVENOUS | Status: AC
  Filled 2023-10-16: qty 20

## 2023-10-16 MED ORDER — CEFAZOLIN SODIUM-DEXTROSE 2-4 GM/100ML-% IV SOLN
2.0000 g | Freq: Three times a day (TID) | INTRAVENOUS | Status: DC
  Administered 2023-10-16: 2 g via INTRAVENOUS
  Filled 2023-10-16: qty 100

## 2023-10-16 MED ORDER — POLYETHYLENE GLYCOL 3350 17 G PO PACK: 17.0000 g | PACK | Freq: Every day | ORAL | Status: DC | PRN

## 2023-10-16 MED ORDER — FENTANYL CITRATE (PF) 250 MCG/5ML IJ SOLN
INTRAMUSCULAR | Status: AC
  Filled 2023-10-16: qty 5

## 2023-10-16 MED ORDER — OXYCODONE HCL 5 MG/5ML PO SOLN
5.0000 mg | Freq: Once | ORAL | Status: AC | PRN
Start: 2023-10-16 — End: 2023-10-16

## 2023-10-16 MED ORDER — PHENYLEPHRINE 80 MCG/ML (10ML) SYRINGE FOR IV PUSH (FOR BLOOD PRESSURE SUPPORT)
PREFILLED_SYRINGE | INTRAVENOUS | Status: AC
  Filled 2023-10-16: qty 10

## 2023-10-16 MED ORDER — METHOCARBAMOL 1000 MG/10ML IJ SOLN
1000.0000 mg | Freq: Once | INTRAMUSCULAR | Status: AC
  Administered 2023-10-16: 1000 mg via INTRAVENOUS
  Filled 2023-10-16: qty 10

## 2023-10-16 MED ORDER — OXYCODONE HCL 5 MG PO TABS
5.0000 mg | ORAL_TABLET | Freq: Once | ORAL | Status: AC | PRN
  Administered 2023-10-16: 5 mg via ORAL

## 2023-10-16 MED ORDER — ACETAMINOPHEN 10 MG/ML IV SOLN
INTRAVENOUS | Status: AC
  Filled 2023-10-16: qty 100

## 2023-10-16 MED ORDER — METOPROLOL TARTRATE 5 MG/5ML IV SOLN: 5.0000 mg | Freq: Four times a day (QID) | INTRAVENOUS | Status: DC | PRN

## 2023-10-16 MED ORDER — IOHEXOL 350 MG/ML SOLN
100.0000 mL | Freq: Once | INTRAVENOUS | Status: AC | PRN
  Administered 2023-10-16: 100 mL via INTRAVENOUS

## 2023-10-16 MED ORDER — OXYCODONE HCL 5 MG PO TABS
5.0000 mg | ORAL_TABLET | ORAL | Status: DC | PRN
  Administered 2023-10-16: 5 mg via ORAL
  Administered 2023-10-17 (×2): 10 mg via ORAL
  Filled 2023-10-16: qty 2
  Filled 2023-10-16: qty 1
  Filled 2023-10-16: qty 2

## 2023-10-16 MED ORDER — ONDANSETRON 4 MG PO TBDP: 4.0000 mg | ORAL_TABLET | Freq: Four times a day (QID) | ORAL | Status: DC | PRN

## 2023-10-16 MED ORDER — POLYETHYLENE GLYCOL 3350 17 G PO PACK: 17.0000 g | PACK | Freq: Every day | ORAL | Status: DC

## 2023-10-16 MED ORDER — TETANUS-DIPHTH-ACELL PERTUSSIS 5-2.5-18.5 LF-MCG/0.5 IM SUSY
0.5000 mL | PREFILLED_SYRINGE | Freq: Once | INTRAMUSCULAR | Status: AC
  Administered 2023-10-16: 0.5 mL via INTRAMUSCULAR

## 2023-10-16 MED ORDER — 0.9 % SODIUM CHLORIDE (POUR BTL) OPTIME
TOPICAL | Status: DC | PRN
  Administered 2023-10-16: 1000 mL

## 2023-10-16 MED ORDER — HYDRALAZINE HCL 20 MG/ML IJ SOLN: 10.0000 mg | INTRAMUSCULAR | Status: DC | PRN

## 2023-10-16 MED ORDER — CEFAZOLIN SODIUM-DEXTROSE 2-4 GM/100ML-% IV SOLN
2.0000 g | INTRAVENOUS | Status: AC
  Administered 2023-10-16: 2 g via INTRAVENOUS
  Filled 2023-10-16: qty 100

## 2023-10-16 MED ORDER — OXYCODONE HCL 5 MG PO TABS: 5.0000 mg | ORAL_TABLET | ORAL | Status: DC | PRN

## 2023-10-16 MED ORDER — PROPOFOL 10 MG/ML IV BOLUS
INTRAVENOUS | Status: DC | PRN
  Administered 2023-10-16: 200 mg via INTRAVENOUS

## 2023-10-16 MED ORDER — ONDANSETRON HCL 4 MG PO TABS: 4.0000 mg | ORAL_TABLET | Freq: Four times a day (QID) | ORAL | Status: DC | PRN

## 2023-10-16 MED ORDER — CHLORHEXIDINE GLUCONATE 0.12 % MT SOLN: 15.0000 mL | Freq: Once | OROMUCOSAL | Status: DC

## 2023-10-16 MED ORDER — MELATONIN 3 MG PO TABS: 3.0000 mg | ORAL_TABLET | Freq: Every evening | ORAL | Status: DC | PRN

## 2023-10-16 MED ORDER — ONDANSETRON HCL 4 MG/2ML IJ SOLN
INTRAMUSCULAR | Status: AC
  Filled 2023-10-16: qty 2

## 2023-10-16 MED ORDER — METOCLOPRAMIDE HCL 5 MG/ML IJ SOLN: 5.0000 mg | Freq: Three times a day (TID) | INTRAMUSCULAR | Status: DC | PRN

## 2023-10-16 MED ORDER — OXYCODONE HCL 5 MG PO TABS: 10.0000 mg | ORAL_TABLET | ORAL | Status: DC | PRN

## 2023-10-16 MED ORDER — ENOXAPARIN SODIUM 30 MG/0.3ML IJ SOSY: 30.0000 mg | PREFILLED_SYRINGE | Freq: Two times a day (BID) | INTRAMUSCULAR | Status: DC

## 2023-10-16 MED ORDER — FENTANYL CITRATE PF 50 MCG/ML IJ SOSY
75.0000 ug | PREFILLED_SYRINGE | Freq: Once | INTRAMUSCULAR | Status: AC
  Administered 2023-10-16: 75 ug via INTRAVENOUS

## 2023-10-16 MED ORDER — SUGAMMADEX SODIUM 200 MG/2ML IV SOLN
INTRAVENOUS | Status: DC | PRN
  Administered 2023-10-16: 20 mg via INTRAVENOUS

## 2023-10-16 MED ORDER — LIDOCAINE 2% (20 MG/ML) 5 ML SYRINGE
INTRAMUSCULAR | Status: DC | PRN
  Administered 2023-10-16: 60 mg via INTRAVENOUS

## 2023-10-16 MED ORDER — MIDAZOLAM HCL 2 MG/2ML IJ SOLN
INTRAMUSCULAR | Status: DC | PRN
  Administered 2023-10-16: 2 mg via INTRAVENOUS

## 2023-10-16 MED ORDER — DOCUSATE SODIUM 100 MG PO CAPS
100.0000 mg | ORAL_CAPSULE | Freq: Two times a day (BID) | ORAL | Status: DC
  Administered 2023-10-16 – 2023-10-18 (×3): 100 mg via ORAL
  Filled 2023-10-16 (×3): qty 1

## 2023-10-16 SURGICAL SUPPLY — 57 items
BAG COUNTER SPONGE SURGICOUNT (BAG) ×1 IMPLANT
BIT DRILL CALIBRATED 4.2 (BIT) IMPLANT
BIT DRILL CANN FLEX 14 (BIT) IMPLANT
BIT DRILL SHORT 4.2 (BIT) IMPLANT
BLADE SURG 10 STRL SS (BLADE) ×2 IMPLANT
BNDG COHESIVE 4X5 TAN STRL (GAUZE/BANDAGES/DRESSINGS) ×1 IMPLANT
BNDG COHESIVE 6X5 TAN ST LF (GAUZE/BANDAGES/DRESSINGS) IMPLANT
BNDG ELASTIC 4X5.8 VLCR STR LF (GAUZE/BANDAGES/DRESSINGS) ×1 IMPLANT
BNDG ELASTIC 6X5.8 VLCR STR LF (GAUZE/BANDAGES/DRESSINGS) ×1 IMPLANT
BRUSH SCRUB EZ PLAIN DRY (MISCELLANEOUS) ×2 IMPLANT
CHLORAPREP W/TINT 26 (MISCELLANEOUS) ×1 IMPLANT
COVER SURGICAL LIGHT HANDLE (MISCELLANEOUS) ×1 IMPLANT
DERMABOND ADVANCED .7 DNX12 (GAUZE/BANDAGES/DRESSINGS) IMPLANT
DRAPE C-ARM 35X43 STRL (DRAPES) ×1 IMPLANT
DRAPE C-ARMOR (DRAPES) ×1 IMPLANT
DRAPE HALF SHEET 40X57 (DRAPES) ×2 IMPLANT
DRAPE IMP U-DRAPE 54X76 (DRAPES) ×2 IMPLANT
DRAPE INCISE IOBAN 66X45 STRL (DRAPES) ×1 IMPLANT
DRAPE SURG 17X23 STRL (DRAPES) ×1 IMPLANT
DRAPE SURG ORHT 6 SPLT 77X108 (DRAPES) ×2 IMPLANT
DRAPE U-SHAPE 47X51 STRL (DRAPES) ×1 IMPLANT
DRESSING MEPILEX FLEX 4X4 (GAUZE/BANDAGES/DRESSINGS) ×3 IMPLANT
DRILL BIT CALIBRATED 4.2 (BIT) ×1
DRSG MEPILEX FLEX 4X4 (GAUZE/BANDAGES/DRESSINGS) ×5
DRSG MEPILEX POST OP 4X8 (GAUZE/BANDAGES/DRESSINGS) ×1 IMPLANT
ELECT REM PT RETURN 9FT ADLT (ELECTROSURGICAL) ×1
ELECTRODE REM PT RTRN 9FT ADLT (ELECTROSURGICAL) ×1 IMPLANT
GLOVE BIO SURGEON STRL SZ 6.5 (GLOVE) ×3 IMPLANT
GLOVE BIO SURGEON STRL SZ7.5 (GLOVE) ×3 IMPLANT
GLOVE BIOGEL PI IND STRL 6.5 (GLOVE) ×1 IMPLANT
GLOVE BIOGEL PI IND STRL 7.5 (GLOVE) ×1 IMPLANT
GOWN STRL REUS W/ TWL LRG LVL3 (GOWN DISPOSABLE) ×3 IMPLANT
GOWN STRL REUS W/ TWL XL LVL3 (GOWN DISPOSABLE) ×1 IMPLANT
GUIDEWIRE 3.2X400 (WIRE) IMPLANT
KIT BASIN OR (CUSTOM PROCEDURE TRAY) ×1 IMPLANT
KIT TURNOVER KIT B (KITS) ×1 IMPLANT
MANIFOLD NEPTUNE II (INSTRUMENTS) ×1 IMPLANT
NAIL CANN FRN TI 10X400 RT (Nail) IMPLANT
NS IRRIG 1000ML POUR BTL (IV SOLUTION) ×1 IMPLANT
PACK GENERAL/GYN (CUSTOM PROCEDURE TRAY) ×1 IMPLANT
PAD ARMBOARD 7.5X6 YLW CONV (MISCELLANEOUS) ×2 IMPLANT
REAMER ROD DEEP FLUTE 2.5X950 (INSTRUMENTS) IMPLANT
SCREW LOCK IM 44X5X (Screw) IMPLANT
SCREW LOCK IM NAIL 5X70 (Screw) IMPLANT
SCREW LOCK IM NL 5X38 (Screw) IMPLANT
SCREW LOCK IM TI 5X40 (Screw) IMPLANT
STAPLER VISISTAT 35W (STAPLE) ×1 IMPLANT
STOCKINETTE IMPERVIOUS LG (DRAPES) ×1 IMPLANT
SUT ETHILON 3 0 PS 1 (SUTURE) ×1 IMPLANT
SUT MNCRL AB 3-0 PS2 18 (SUTURE) ×1 IMPLANT
SUT MON AB 2-0 CT1 36 (SUTURE) IMPLANT
SUT VIC AB 0 CT1 27XBRD ANBCTR (SUTURE) IMPLANT
SUT VIC AB 2-0 CT1 TAPERPNT 27 (SUTURE) ×2 IMPLANT
TOWEL GREEN STERILE (TOWEL DISPOSABLE) ×2 IMPLANT
TOWEL GREEN STERILE FF (TOWEL DISPOSABLE) ×1 IMPLANT
UNDERPAD 30X36 HEAVY ABSORB (UNDERPADS AND DIAPERS) ×1 IMPLANT
WATER STERILE IRR 1000ML POUR (IV SOLUTION) ×1 IMPLANT

## 2023-10-16 NOTE — Anesthesia Procedure Notes (Signed)
 Procedure Name: Intubation Date/Time: 10/16/2023 12:47 PM  Performed by: Linard Reno, CRNAPre-anesthesia Checklist: Patient identified, Emergency Drugs available, Suction available and Patient being monitored Patient Re-evaluated:Patient Re-evaluated prior to induction Oxygen Delivery Method: Circle System Utilized Preoxygenation: Pre-oxygenation with 100% oxygen Induction Type: IV induction Ventilation: Mask ventilation without difficulty Laryngoscope Size: Miller and 3 Tube type: Oral Nasal Tubes: Magill forceps- large, utilized Tube size: 8.0 mm Number of attempts: 1 Airway Equipment and Method: Stylet and Oral airway Placement Confirmation: ETT inserted through vocal cords under direct vision, positive ETCO2 and breath sounds checked- equal and bilateral Secured at: 23 cm Tube secured with: Tape Dental Injury: Teeth and Oropharynx as per pre-operative assessment

## 2023-10-16 NOTE — ED Notes (Signed)
 Patient transported to CT
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
## 2023-10-16 NOTE — ED Notes (Signed)
 Called ccmd patient placed on monitor

## 2023-10-16 NOTE — ED Notes (Signed)
 Family updated as to patient's status.

## 2023-10-16 NOTE — Interval H&P Note (Signed)
 History and Physical Interval Note:  10/16/2023 12:27 PM  Louis Camacho  has presented today for surgery, with the diagnosis of Right femur fracture.  The various methods of treatment have been discussed with the patient and family. After consideration of risks, benefits and other options for treatment, the patient has consented to  Procedure(s): INTRAMEDULLARY (IM) NAIL FEMORAL (Right) as a surgical intervention.  The patient's history has been reviewed, patient examined, no change in status, stable for surgery.  I have reviewed the patient's chart and labs.  Questions were answered to the patient's satisfaction.     Josemaria Brining P Armel Rabbani
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
## 2023-10-16 NOTE — Progress Notes (Signed)
 Orthopedic Tech Progress Note Patient Details:  Louis Camacho 01/30/1996 [id number]  Musculoskeletal Traction Type of Traction: Bucks Skin Traction Traction Location: RLE Traction Weight: 10 lbs   Post Interventions Patient Tolerated: Well  Toi Foster 10/16/2023, 2:49 AM
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
## 2023-10-16 NOTE — Plan of Care (Signed)
  Problem: Clinical Measurements: Goal: Ability to maintain clinical measurements within normal limits will improve Outcome: Progressing   Problem: Nutrition: Goal: Adequate nutrition will be maintained Outcome: Progressing   Problem: Pain Management: Goal: General experience of comfort will improve Outcome: Progressing
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
## 2023-10-16 NOTE — Transfer of Care (Signed)
 Immediate Anesthesia Transfer of Care Note  Patient: Louis Camacho  Procedure(s) Performed: INTRAMEDULLARY (IM) NAIL FEMORAL (Right: Leg Upper)  Patient Location: PACU  Anesthesia Type:General  Level of Consciousness: awake, alert , and oriented  Airway & Oxygen Therapy: Patient Spontanous Breathing  Post-op Assessment: Report given to RN and Post -op Vital signs reviewed and stable  Post vital signs: Reviewed and stable  Last Vitals:  Vitals Value Taken Time  BP 131/92 10/16/23 1500  Temp 36.5 C 10/16/23 1439  Pulse 53 10/16/23 1505  Resp 12 10/16/23 1505  SpO2 100 % 10/16/23 1505  Vitals shown include unfiled device data.  Last Pain:  Vitals:   10/16/23 1157  TempSrc:   PainSc: 9          Complications: No notable events documented.
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
## 2023-10-16 NOTE — TOC CAGE-AID Note (Signed)
 Transition of Care [facility]) - CAGE-AID Screening   Patient Details  Name: Louis Camacho MRN: [id number] Date of Birth: [birth year]     Misty Amour, RN Phone Number: 10/16/2023, 6:12 AM   Clinical Narrative: Pt reports no tobacco usage, occasional etoh and occasional THC usage. Denies needing resources   CAGE-AID Screening:    Have You Ever Felt You Ought to Cut Down on Your Drinking or Drug Use?: No Have People Annoyed You By Critizing Your Drinking Or Drug Use?: No Have You Felt Bad Or Guilty About Your Drinking Or Drug Use?: No Have You Ever Had a Drink or Used Drugs First Thing In The Morning to Steady Your Nerves or to Get Rid of a Hangover?: No CAGE-AID Score: 0  Substance Abuse Education Offered: Yes
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
## 2023-10-17 ENCOUNTER — Encounter (HOSPITAL_COMMUNITY): Payer: Self-pay | Admitting: Student

## 2023-10-17 LAB — BASIC METABOLIC PANEL
Anion gap: 7 (ref 5–15)
BUN: 6 mg/dL (ref 6–20)
CO2: 24 mmol/L (ref 22–32)
Calcium: 7.8 mg/dL — ABNORMAL LOW (ref 8.9–10.3)
Chloride: 103 mmol/L (ref 98–111)
Creatinine, Ser: 0.85 mg/dL (ref 0.61–1.24)
GFR, Estimated: >60 mL/min (ref >60)
Glucose, Bld: 128 mg/dL — ABNORMAL HIGH (ref 70–99)
Potassium: 3.9 mmol/L (ref 3.5–5.1)
Sodium: 134 mmol/L — ABNORMAL LOW (ref 135–145)

## 2023-10-17 LAB — CBC
HCT: 35.3 % — ABNORMAL LOW (ref 39.0–52.0)
Hemoglobin: 11.5 g/dL — ABNORMAL LOW (ref 13.0–17.0)
MCH: 31.8 pg (ref 26.0–34.0)
MCHC: 32.6 g/dL (ref 30.0–36.0)
MCV: 97.5 fL (ref 80.0–100.0)
Platelets: 198 10*3/uL (ref 150–400)
RBC: 3.62 MIL/uL — ABNORMAL LOW (ref 4.22–5.81)
RDW: 11.1 % — ABNORMAL LOW (ref 11.5–15.5)
WBC: 8.9 10*3/uL (ref 4.0–10.5)
nRBC: 0 % (ref 0.0–0.2)

## 2023-10-17 LAB — VITAMIN D 25 HYDROXY (VIT D DEFICIENCY, FRACTURES): Vit D, 25-Hydroxy: 6.65 ng/mL — ABNORMAL LOW (ref 30–100)

## 2023-10-17 MED ORDER — VITAMIN D (ERGOCALCIFEROL) 1.25 MG (50000 UNIT) PO CAPS
50000.0000 [IU] | ORAL_CAPSULE | ORAL | Status: DC
  Administered 2023-10-17: 50000 [IU] via ORAL
  Filled 2023-10-17: qty 1

## 2023-10-17 MED ORDER — METHOCARBAMOL 500 MG PO TABS
1000.0000 mg | ORAL_TABLET | Freq: Four times a day (QID) | ORAL | Status: DC
Start: 2023-10-17 — End: 2023-10-18
  Administered 2023-10-17 – 2023-10-18 (×6): 1000 mg via ORAL
  Filled 2023-10-17 (×6): qty 2

## 2023-10-17 MED ORDER — IBUPROFEN 600 MG PO TABS
600.0000 mg | ORAL_TABLET | Freq: Four times a day (QID) | ORAL | Status: DC
  Administered 2023-10-17 – 2023-10-18 (×5): 600 mg via ORAL
  Filled 2023-10-17 (×6): qty 1

## 2023-10-17 MED ORDER — POLYETHYLENE GLYCOL 3350 17 G PO PACK
17.0000 g | PACK | Freq: Two times a day (BID) | ORAL | Status: DC
  Administered 2023-10-17 – 2023-10-18 (×2): 17 g via ORAL
  Filled 2023-10-17 (×2): qty 1

## 2023-10-17 MED ORDER — ENOXAPARIN SODIUM 30 MG/0.3ML IJ SOSY
30.0000 mg | PREFILLED_SYRINGE | Freq: Two times a day (BID) | INTRAMUSCULAR | Status: DC
Start: 2023-10-17 — End: 2023-10-18
  Administered 2023-10-17 – 2023-10-18 (×3): 30 mg via SUBCUTANEOUS
  Filled 2023-10-17 (×3): qty 0.3

## 2023-10-17 MED ORDER — HYDROMORPHONE HCL 1 MG/ML IJ SOLN: 0.5000 mg | INTRAMUSCULAR | Status: DC | PRN

## 2023-10-17 MED ORDER — OXYCODONE HCL 5 MG PO TABS
10.0000 mg | ORAL_TABLET | ORAL | Status: DC | PRN
  Administered 2023-10-17: 10 mg via ORAL
  Administered 2023-10-18: 15 mg via ORAL
  Filled 2023-10-17: qty 3
  Filled 2023-10-17: qty 2
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
## 2023-10-17 NOTE — Progress Notes (Signed)
When I entered the room it had strong order of Marijuana I asked if smoking was done in the room the patient quest admitted to smoking. I informed that no smoking is allowed of any kind in hospital and no tobacco products of any kind is the policy. The quest apologized

## 2023-10-17 NOTE — Discharge Instructions (Addendum)
Orthopaedic Trauma Service Discharge Instructions   General Discharge Instructions  WEIGHT BEARING STATUS: Touchdown weightbearing right lower extremity  RANGE OF MOTION/ACTIVITY: Okay for unrestricted hip and knee range of motion  Wound Care: You may remove your surgical dressing on postoperative day #2 (10/18/2023). Incisions can be left open to air if there is no drainage.  You do not need to apply any new bandages to your incisions once surgical dressings are removed.  Once the incision is completely dry and without drainage, it may be left open to air out.  Showering may begin postoperative day #3 (10/19/2023).  Clean incision gently with soap and water.  DVT/PE prophylaxis: Aspirin 81 mg daily x 30 days  Diet: as you were eating previously.  Can use over the counter stool softeners and bowel preparations, such as Miralax, to help with bowel movements.  Narcotics can be constipating.  Be sure to drink plenty of fluids  PAIN MEDICATION USE AND EXPECTATIONS  You have likely been given narcotic medications to help control your pain.  After a traumatic event that results in an fracture (broken bone) with or without surgery, it is ok to use narcotic pain medications to help control one's pain.  We understand that everyone responds to pain differently and each individual patient will be evaluated on a regular basis for the continued need for narcotic medications. Ideally, narcotic medication use should last no more than 6-8 weeks (coinciding with fracture healing).   As a patient it is your responsibility as well to monitor narcotic medication use and report the amount and frequency you use these medications when you come to your office visit.   We would also advise that if you are using narcotic medications, you should take a dose prior to therapy to maximize you participation.  IF YOU ARE ON NARCOTIC MEDICATIONS IT IS NOT PERMISSIBLE TO OPERATE A MOTOR VEHICLE (MOTORCYCLE/CAR/TRUCK/MOPED) OR  HEAVY MACHINERY DO NOT MIX NARCOTICS WITH OTHER CNS (CENTRAL NERVOUS SYSTEM) DEPRESSANTS SUCH AS ALCOHOL   STOP SMOKING OR USING NICOTINE PRODUCTS!!!!  As discussed nicotine severely impairs your body's ability to heal surgical and traumatic wounds but also impairs bone healing.  Wounds and bone heal by forming microscopic blood vessels (angiogenesis) and nicotine is a vasoconstrictor (essentially, shrinks blood vessels).  Therefore, if vasoconstriction occurs to these microscopic blood vessels they essentially disappear and are unable to deliver necessary nutrients to the healing tissue.  This is one modifiable factor that you can do to dramatically increase your chances of healing your injury.    (This means no smoking, no nicotine gum, patches, etc)  DO NOT USE NONSTEROIDAL ANTI-INFLAMMATORY DRUGS (NSAID'S)  Using products such as Advil (ibuprofen), Aleve (naproxen), Motrin (ibuprofen) for additional pain control during fracture healing can delay and/or prevent the healing response.  If you would like to take over the counter (OTC) medication, Tylenol (acetaminophen) is ok.  However, some narcotic medications that are given for pain control contain acetaminophen as well. Therefore, you should not exceed more than 4000 mg of tylenol in a day if you do not have liver disease.  Also note that there are may OTC medicines, such as cold medicines and allergy medicines that my contain tylenol as well.  If you have any questions about medications and/or interactions please ask your doctor/PA or your pharmacist.      ICE AND ELEVATE INJURED/OPERATIVE EXTREMITY  Using ice and elevating the injured extremity above your heart can help with swelling and pain control.  Icing  in a pulsatile fashion, such as 20 minutes on and 20 minutes off, can be followed.    Do not place ice directly on skin. Make sure there is a barrier between to skin and the ice pack.    Using frozen items such as frozen peas works well as  the conform nicely to the are that needs to be iced.  USE AN ACE WRAP OR TED HOSE FOR SWELLING CONTROL  In addition to icing and elevation, Ace wraps or TED hose are used to help limit and resolve swelling.  It is recommended to use Ace wraps or TED hose until you are informed to stop.    When using Ace Wraps start the wrapping distally (farthest away from the body) and wrap proximally (closer to the body)   Example: If you had surgery on your leg or thing and you do not have a splint on, start the ace wrap at the toes and work your way up to the thigh        If you had surgery on your upper extremity and do not have a splint on, start the ace wrap at your fingers and work your way up to the upper arm   CALL THE OFFICE FOR MEDICATION REFILLS OR WITH ANY QUESTIONS/CONCERNS: [phone]   VISIT OUR WEBSITE FOR ADDITIONAL INFORMATION: [email or web address]   Discharge Wound Care Instructions  Do NOT apply any ointments, solutions or lotions to pin sites or surgical wounds.  These prevent needed drainage and even though solutions like hydrogen peroxide kill bacteria, they also damage cells lining the pin sites that help fight infection.  Applying lotions or ointments can keep the wounds moist and can cause them to breakdown and open up as well. This can increase the risk for infection. When in doubt call the office.   If any drainage is noted, use foam dressings (Mepilex) - These dressing supplies should be available at local medical supply stores [facility], [facility], etc) as well as Insurance claims handler ([employer], [facility], [employer], etc)  Once the incision is completely dry and without drainage, it may be left open to air out.  Showering may begin 36-48 hours later.  Cleaning gently with soap and water.   If you notice any enlarging area of redness or warmth; red streaking up or down your leg; or drainage from your incision please call your surgeon or go to the emergency department for  evaluation.

## 2023-10-17 NOTE — Anesthesia Postprocedure Evaluation (Signed)
Anesthesia Post Note  Patient: Louis Camacho  Procedure(s) Performed: INTRAMEDULLARY (IM) NAIL FEMORAL (Right: Leg Upper)     Patient location during evaluation: PACU Anesthesia Type: General Level of consciousness: awake and alert Pain management: pain level controlled Vital Signs Assessment: post-procedure vital signs reviewed and stable Respiratory status: spontaneous breathing, nonlabored ventilation, respiratory function stable and patient connected to nasal cannula oxygen Cardiovascular status: blood pressure returned to baseline and stable Postop Assessment: no apparent nausea or vomiting Anesthetic complications: no   No notable events documented.  Last Vitals:  Vitals:   10/17/23 1644 10/17/23 2003  BP: 114/66 (!) 105/54  Pulse: 73 83  Resp: 18 17  Temp: 36.9 C 36.8 C  SpO2: 100% 100%    Last Pain:  Vitals:   10/17/23 2003  TempSrc: Oral  PainSc:                  Dietrich Ke S

## 2023-10-18 ENCOUNTER — Other Ambulatory Visit (HOSPITAL_COMMUNITY): Payer: Self-pay

## 2023-10-18 DIAGNOSIS — F439 Reaction to severe stress, unspecified: Secondary | ICD-10-CM

## 2023-10-18 DIAGNOSIS — F430 Acute stress reaction: Secondary | ICD-10-CM | POA: Clinically undetermined

## 2023-10-18 LAB — CBC
HCT: 32.4 % — ABNORMAL LOW (ref 39.0–52.0)
Hemoglobin: 10.8 g/dL — ABNORMAL LOW (ref 13.0–17.0)
MCH: 32.5 pg (ref 26.0–34.0)
MCHC: 33.3 g/dL (ref 30.0–36.0)
MCV: 97.6 fL (ref 80.0–100.0)
Platelets: 187 10*3/uL (ref 150–400)
RBC: 3.32 MIL/uL — ABNORMAL LOW (ref 4.22–5.81)
RDW: 11.4 % — ABNORMAL LOW (ref 11.5–15.5)
WBC: 8.1 10*3/uL (ref 4.0–10.5)
nRBC: 0 % (ref 0.0–0.2)

## 2023-10-18 MED ORDER — ASCORBIC ACID 500 MG PO TABS: 500.0000 mg | ORAL_TABLET | Freq: Two times a day (BID) | ORAL | 0 refills | Status: AC

## 2023-10-18 MED ORDER — ACETAMINOPHEN 500 MG PO TABS: 1000.0000 mg | ORAL_TABLET | Freq: Three times a day (TID) | ORAL | Status: AC | PRN

## 2023-10-18 MED ORDER — FERROUS SULFATE 325 (65 FE) MG PO TABS
325.0000 mg | ORAL_TABLET | Freq: Every day | ORAL | 0 refills | Status: AC
  Filled 2023-10-18: qty 30, 30d supply, fill #0

## 2023-10-18 MED ORDER — METHOCARBAMOL 500 MG PO TABS
1000.0000 mg | ORAL_TABLET | Freq: Four times a day (QID) | ORAL | 0 refills | Status: AC | PRN
  Filled 2023-10-18: qty 80, 10d supply, fill #0

## 2023-10-18 MED ORDER — VITAMIN C 500 MG PO TABS
500.0000 mg | ORAL_TABLET | Freq: Two times a day (BID) | ORAL | Status: DC
  Administered 2023-10-18: 500 mg via ORAL
  Filled 2023-10-18: qty 1

## 2023-10-18 MED ORDER — ASPIRIN 81 MG PO TBEC
81.0000 mg | DELAYED_RELEASE_TABLET | Freq: Every day | ORAL | 0 refills | Status: AC
  Filled 2023-10-18: qty 30, 30d supply, fill #0

## 2023-10-18 MED ORDER — POLYETHYLENE GLYCOL 3350 17 G PO PACK: 17.0000 g | PACK | Freq: Two times a day (BID) | ORAL | Status: AC | PRN

## 2023-10-18 MED ORDER — DOCUSATE SODIUM 100 MG PO CAPS: 100.0000 mg | ORAL_CAPSULE | Freq: Two times a day (BID) | ORAL | Status: AC | PRN

## 2023-10-18 MED ORDER — OXYCODONE HCL 10 MG PO TABS
10.0000 mg | ORAL_TABLET | ORAL | 0 refills | Status: AC | PRN
  Filled 2023-10-18: qty 25, 3d supply, fill #0

## 2023-10-18 MED ORDER — VITAMIN D (ERGOCALCIFEROL) 1.25 MG (50000 UNIT) PO CAPS
50000.0000 [IU] | ORAL_CAPSULE | ORAL | 0 refills | Status: AC
  Filled 2023-10-18: qty 8, 56d supply, fill #0

## 2023-10-18 MED ORDER — FERROUS SULFATE 325 (65 FE) MG PO TABS: 325.0000 mg | ORAL_TABLET | Freq: Every day | ORAL | Status: DC

## 2023-10-18 MED ORDER — GABAPENTIN 300 MG PO CAPS
300.0000 mg | ORAL_CAPSULE | Freq: Three times a day (TID) | ORAL | 0 refills | Status: AC
  Filled 2023-10-18: qty 90, 30d supply, fill #0

## 2023-10-18 MED ORDER — IBUPROFEN 600 MG PO TABS: 600.0000 mg | ORAL_TABLET | Freq: Four times a day (QID) | ORAL | Status: AC | PRN
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
## 2023-10-18 NOTE — Progress Notes (Signed)
Orthopedic Tech Progress Note Patient Details:  Louis Camacho 15-Jun-1996 [id number]  Ortho Devices Type of Ortho Device: Crutches Ortho Device/Splint Interventions: Ordered, Application, Adjustment   Post Interventions Patient Tolerated: Well  Tonye Pearson 10/18/2023, 2:25 PM

## 2023-10-18 NOTE — Plan of Care (Signed)
  Problem: Health Behavior/Discharge Planning: Goal: Ability to manage health-related needs will improve Outcome: Progressing   Problem: Clinical Measurements: Goal: Ability to maintain clinical measurements within normal limits will improve Outcome: Progressing Goal: Will remain free from infection Outcome: Progressing   
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
## 2023-10-18 NOTE — Progress Notes (Signed)
Medications details complete for AVS.
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
## 2023-10-23 ENCOUNTER — Ambulatory Visit: Payer: MEDICAID | Attending: Physician Assistant | Admitting: Rehabilitative and Restorative Service Providers"

## 2023-10-23 ENCOUNTER — Encounter: Payer: Self-pay | Admitting: Rehabilitative and Restorative Service Providers"

## 2023-10-23 ENCOUNTER — Other Ambulatory Visit: Payer: Self-pay

## 2023-10-23 DIAGNOSIS — M6281 Muscle weakness (generalized): Secondary | ICD-10-CM

## 2023-10-23 DIAGNOSIS — M79604 Pain in right leg: Secondary | ICD-10-CM

## 2023-10-23 DIAGNOSIS — W3400XA Accidental discharge from unspecified firearms or gun, initial encounter: Secondary | ICD-10-CM | POA: Diagnosis not present
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
## 2023-10-30 ENCOUNTER — Ambulatory Visit: Payer: MEDICAID | Admitting: Physical Therapy

## 2023-11-04 ENCOUNTER — Encounter: Payer: Self-pay | Admitting: Physical Therapy

## 2023-11-06 ENCOUNTER — Encounter: Payer: Self-pay | Admitting: Physical Therapy

## 2023-11-11 ENCOUNTER — Encounter: Payer: Self-pay | Admitting: Physical Therapy

## 2023-11-13 ENCOUNTER — Encounter: Payer: Self-pay | Admitting: Physical Therapy

## 2023-11-18 ENCOUNTER — Encounter: Payer: Self-pay | Admitting: Rehabilitative and Restorative Service Providers"

## 2023-11-20 ENCOUNTER — Ambulatory Visit: Payer: MEDICAID | Admitting: Physical Therapy

## 2023-11-25 ENCOUNTER — Encounter: Payer: Self-pay | Admitting: Rehabilitative and Restorative Service Providers"

## 2023-11-27 ENCOUNTER — Encounter: Payer: Self-pay | Admitting: Rehabilitative and Restorative Service Providers"
